# Patient Record
Sex: Female | Born: 1995 | Race: Black or African American | Hispanic: No | Marital: Married | Smoking: Never smoker
Health system: Southern US, Community
[De-identification: ages and names within clinical notes are randomized; demographics above are authoritative.]

## PROBLEM LIST (undated history)

## (undated) DIAGNOSIS — Z789 Other specified health status: Secondary | ICD-10-CM

## (undated) HISTORY — PX: NO PAST SURGERIES: SHX2092

---

## 2016-09-20 NOTE — L&D Delivery Note (Signed)
Patient is 21 y.o. G1P0 [redacted]w[redacted]d admitted in latent labor. S/p augmentation with  Pitocin. SROM at 0955.  Prenatal course also complicated by none.  Delivery Note At 3:35 PM a viable female was delivered via Vaginal, Spontaneous Delivery (Presentation: LOA ).  APGAR: 9, 9; weight pending.   Placenta status: Intact.  Cord: 3V with the following complications: None.  Cord pH: N/A  Anesthesia:  Epidural Episiotomy:  None Lacerations: 1st degree perineal  Suture Repair: None Est. Blood Loss (mL):  150  Upon arrival patient was complete and pushing. She pushed with good maternal effort to deliver a viable infant . Nuchal cord x1,easily reduced. Baby delivered without difficulty (anterior shoulder delivered with ease), was noted to have good tone and place on maternal abdomen for oral suctioning, drying and stimulation. Delayed cord clamping performed. Placenta delivered spontaneously with gentle cord traction. Fundus firm with massage and Pitocin. Perineum inspected and found to have 1st degree laceration, which was found to be hemostatic. Counts of sharps, instruments, and lap pads were all correct.  Mom to postpartum.  Baby to Couplet care / Skin to Skin.  Caryl Ada, DO OB Fellow

## 2017-04-18 LAB — OB RESULTS CONSOLE HIV ANTIBODY (ROUTINE TESTING): HIV: NONREACTIVE

## 2017-04-18 LAB — OB RESULTS CONSOLE VARICELLA ZOSTER ANTIBODY, IGG: VARICELLA IGG: IMMUNE

## 2017-04-18 LAB — OB RESULTS CONSOLE RUBELLA ANTIBODY, IGM: Rubella: IMMUNE

## 2017-04-18 LAB — OB RESULTS CONSOLE HEPATITIS B SURFACE ANTIGEN: Hepatitis B Surface Ag: NEGATIVE

## 2017-05-16 LAB — OB RESULTS CONSOLE GC/CHLAMYDIA
CHLAMYDIA, DNA PROBE: NEGATIVE
GC PROBE AMP, GENITAL: NEGATIVE

## 2017-05-16 LAB — OB RESULTS CONSOLE GBS: GBS: NEGATIVE

## 2017-06-18 ENCOUNTER — Encounter (HOSPITAL_COMMUNITY): Payer: Self-pay | Admitting: *Deleted

## 2017-06-18 ENCOUNTER — Inpatient Hospital Stay (HOSPITAL_COMMUNITY): Payer: Medicaid Other | Admitting: Anesthesiology

## 2017-06-18 ENCOUNTER — Inpatient Hospital Stay (HOSPITAL_COMMUNITY)
Admission: AD | Admit: 2017-06-18 | Discharge: 2017-06-20 | DRG: 807 | Disposition: A | Discharge status: Home / Self Care | Payer: Medicaid Other | Source: Ambulatory Visit | Attending: Obstetrics and Gynecology | Admitting: Obstetrics and Gynecology

## 2017-06-18 DIAGNOSIS — O26893 Other specified pregnancy related conditions, third trimester: Secondary | ICD-10-CM | POA: Diagnosis present

## 2017-06-18 DIAGNOSIS — Z3A40 40 weeks gestation of pregnancy: Secondary | ICD-10-CM

## 2017-06-18 HISTORY — DX: Other specified health status: Z78.9

## 2017-06-18 LAB — COMPREHENSIVE METABOLIC PANEL
ALBUMIN: 3.3 g/dL — AB (ref 3.5–5.0)
ALK PHOS: 199 U/L — AB (ref 38–126)
ALT: 19 U/L (ref 14–54)
AST: 29 U/L (ref 15–41)
Anion gap: 10 (ref 5–15)
BUN: 11 mg/dL (ref 6–20)
CALCIUM: 9.7 mg/dL (ref 8.9–10.3)
CHLORIDE: 101 mmol/L (ref 101–111)
CO2: 23 mmol/L (ref 22–32)
CREATININE: 0.71 mg/dL (ref 0.44–1.00)
GFR calc non Af Amer: >60 mL/min (ref >60)
GLUCOSE: 89 mg/dL (ref 65–99)
Potassium: 4.6 mmol/L (ref 3.5–5.1)
SODIUM: 134 mmol/L — AB (ref 135–145)
Total Bilirubin: 0.6 mg/dL (ref 0.3–1.2)
Total Protein: 7.9 g/dL (ref 6.5–8.1)

## 2017-06-18 LAB — PROTEIN / CREATININE RATIO, URINE
Creatinine, Urine: 85 mg/dL
PROTEIN CREATININE RATIO: 0.15 mg/mg{creat} (ref 0.00–0.15)
TOTAL PROTEIN, URINE: 13 mg/dL

## 2017-06-18 LAB — CBC
HCT: 35.7 % — ABNORMAL LOW (ref 36.0–46.0)
Hemoglobin: 11.9 g/dL — ABNORMAL LOW (ref 12.0–15.0)
MCH: 26.9 pg (ref 26.0–34.0)
MCHC: 33.3 g/dL (ref 30.0–36.0)
MCV: 80.6 fL (ref 78.0–100.0)
PLATELETS: 321 10*3/uL (ref 150–400)
RBC: 4.43 MIL/uL (ref 3.87–5.11)
RDW: 15.1 % (ref 11.5–15.5)
WBC: 9.3 10*3/uL (ref 4.0–10.5)

## 2017-06-18 LAB — TYPE AND SCREEN
ABO/RH(D): AB POS
ANTIBODY SCREEN: NEGATIVE

## 2017-06-18 LAB — RPR: RPR: NONREACTIVE

## 2017-06-18 LAB — ABO/RH: ABO/RH(D): AB POS

## 2017-06-18 MED ORDER — EPHEDRINE 5 MG/ML INJ: 10.0000 mg | INTRAVENOUS | Status: DC | PRN

## 2017-06-18 MED ORDER — FENTANYL 2.5 MCG/ML BUPIVACAINE 1/10 % EPIDURAL INFUSION (WH - ANES)
14.0000 mL/h | INTRAMUSCULAR | Status: DC | PRN
  Administered 2017-06-18 (×2): 14 mL/h via EPIDURAL
  Administered 2017-06-18: 12 mL/h via EPIDURAL
  Filled 2017-06-18: qty 100

## 2017-06-18 MED ORDER — DIPHENHYDRAMINE HCL 50 MG/ML IJ SOLN: 12.5000 mg | INTRAMUSCULAR | Status: DC | PRN

## 2017-06-18 MED ORDER — LACTATED RINGERS IV SOLN: 500.0000 mL | Freq: Once | INTRAVENOUS | Status: DC

## 2017-06-18 MED ORDER — WITCH HAZEL-GLYCERIN EX PADS: 1.0000 "application " | MEDICATED_PAD | CUTANEOUS | Status: DC | PRN

## 2017-06-18 MED ORDER — COCONUT OIL OIL: 1.0000 "application " | TOPICAL_OIL | Status: DC | PRN

## 2017-06-18 MED ORDER — FENTANYL 2.5 MCG/ML BUPIVACAINE 1/10 % EPIDURAL INFUSION (WH - ANES): 14.0000 mL/h | INTRAMUSCULAR | Status: DC | PRN

## 2017-06-18 MED ORDER — SIMETHICONE 80 MG PO CHEW: 80.0000 mg | CHEWABLE_TABLET | ORAL | Status: DC | PRN

## 2017-06-18 MED ORDER — PRENATAL MULTIVITAMIN CH
1.0000 | ORAL_TABLET | Freq: Every day | ORAL | Status: DC
  Administered 2017-06-19 – 2017-06-20 (×2): 1 via ORAL
  Filled 2017-06-18 (×2): qty 1

## 2017-06-18 MED ORDER — OXYTOCIN 40 UNITS IN LACTATED RINGERS INFUSION - SIMPLE MED: 2.5000 [IU]/h | INTRAVENOUS | Status: DC

## 2017-06-18 MED ORDER — OXYTOCIN BOLUS FROM INFUSION
500.0000 mL | Freq: Once | INTRAVENOUS | Status: AC
  Administered 2017-06-18: 500 mL via INTRAVENOUS

## 2017-06-18 MED ORDER — LIDOCAINE HCL (PF) 1 % IJ SOLN
30.0000 mL | INTRAMUSCULAR | Status: DC | PRN
  Filled 2017-06-18: qty 30

## 2017-06-18 MED ORDER — PHENYLEPHRINE 40 MCG/ML (10ML) SYRINGE FOR IV PUSH (FOR BLOOD PRESSURE SUPPORT)
80.0000 ug | PREFILLED_SYRINGE | INTRAVENOUS | Status: DC | PRN
  Filled 2017-06-18: qty 5

## 2017-06-18 MED ORDER — EPHEDRINE 5 MG/ML INJ
10.0000 mg | INTRAVENOUS | Status: DC | PRN
  Filled 2017-06-18: qty 2

## 2017-06-18 MED ORDER — LACTATED RINGERS IV SOLN
500.0000 mL | INTRAVENOUS | Status: DC | PRN
  Administered 2017-06-18: 250 mL via INTRAVENOUS

## 2017-06-18 MED ORDER — LIDOCAINE HCL (PF) 1 % IJ SOLN
INTRAMUSCULAR | Status: DC | PRN
  Administered 2017-06-18 (×2): 6 mL via EPIDURAL

## 2017-06-18 MED ORDER — ACETAMINOPHEN 325 MG PO TABS: 650.0000 mg | ORAL_TABLET | ORAL | Status: DC | PRN

## 2017-06-18 MED ORDER — OXYCODONE-ACETAMINOPHEN 5-325 MG PO TABS: 2.0000 | ORAL_TABLET | ORAL | Status: DC | PRN

## 2017-06-18 MED ORDER — IBUPROFEN 600 MG PO TABS
600.0000 mg | ORAL_TABLET | Freq: Four times a day (QID) | ORAL | Status: DC
  Administered 2017-06-18 – 2017-06-20 (×8): 600 mg via ORAL
  Filled 2017-06-18 (×8): qty 1

## 2017-06-18 MED ORDER — DIBUCAINE 1 % RE OINT: 1.0000 "application " | TOPICAL_OINTMENT | RECTAL | Status: DC | PRN

## 2017-06-18 MED ORDER — PHENYLEPHRINE 40 MCG/ML (10ML) SYRINGE FOR IV PUSH (FOR BLOOD PRESSURE SUPPORT): 80.0000 ug | PREFILLED_SYRINGE | INTRAVENOUS | Status: DC | PRN

## 2017-06-18 MED ORDER — ONDANSETRON HCL 4 MG/2ML IJ SOLN
4.0000 mg | Freq: Four times a day (QID) | INTRAMUSCULAR | Status: DC | PRN
  Administered 2017-06-18: 4 mg via INTRAVENOUS
  Filled 2017-06-18 (×2): qty 2

## 2017-06-18 MED ORDER — TETANUS-DIPHTH-ACELL PERTUSSIS 5-2.5-18.5 LF-MCG/0.5 IM SUSP: 0.5000 mL | Freq: Once | INTRAMUSCULAR | Status: DC

## 2017-06-18 MED ORDER — OXYCODONE-ACETAMINOPHEN 5-325 MG PO TABS: 1.0000 | ORAL_TABLET | ORAL | Status: DC | PRN

## 2017-06-18 MED ORDER — LACTATED RINGERS IV SOLN
INTRAVENOUS | Status: DC
  Administered 2017-06-18 (×2): via INTRAVENOUS

## 2017-06-18 MED ORDER — TERBUTALINE SULFATE 1 MG/ML IJ SOLN
0.2500 mg | Freq: Once | INTRAMUSCULAR | Status: DC | PRN
  Filled 2017-06-18: qty 1

## 2017-06-18 MED ORDER — FENTANYL CITRATE (PF) 100 MCG/2ML IJ SOLN: 100.0000 ug | INTRAMUSCULAR | Status: DC | PRN

## 2017-06-18 MED ORDER — OXYTOCIN 40 UNITS IN LACTATED RINGERS INFUSION - SIMPLE MED
1.0000 m[IU]/min | INTRAVENOUS | Status: DC
  Administered 2017-06-18: 2 m[IU]/min via INTRAVENOUS
  Filled 2017-06-18: qty 1000

## 2017-06-18 MED ORDER — ACETAMINOPHEN 325 MG PO TABS
650.0000 mg | ORAL_TABLET | ORAL | Status: DC | PRN
  Administered 2017-06-19: 650 mg via ORAL
  Filled 2017-06-18: qty 2

## 2017-06-18 MED ORDER — FENTANYL 2.5 MCG/ML BUPIVACAINE 1/10 % EPIDURAL INFUSION (WH - ANES)
INTRAMUSCULAR | Status: AC
  Administered 2017-06-18: 14 mL/h via EPIDURAL
  Filled 2017-06-18: qty 100

## 2017-06-18 MED ORDER — LACTATED RINGERS IV BOLUS (SEPSIS)
1000.0000 mL | Freq: Once | INTRAVENOUS | Status: AC
  Administered 2017-06-18: 1000 mL via INTRAVENOUS

## 2017-06-18 MED ORDER — ONDANSETRON HCL 4 MG PO TABS: 4.0000 mg | ORAL_TABLET | ORAL | Status: DC | PRN

## 2017-06-18 MED ORDER — LACTATED RINGERS IV SOLN
500.0000 mL | Freq: Once | INTRAVENOUS | Status: AC
  Administered 2017-06-18: 500 mL via INTRAVENOUS

## 2017-06-18 MED ORDER — ONDANSETRON HCL 4 MG/2ML IJ SOLN
4.0000 mg | INTRAMUSCULAR | Status: DC | PRN
  Administered 2017-06-18: 4 mg via INTRAVENOUS

## 2017-06-18 MED ORDER — SOD CITRATE-CITRIC ACID 500-334 MG/5ML PO SOLN: 30.0000 mL | ORAL | Status: DC | PRN

## 2017-06-18 MED ORDER — DIPHENHYDRAMINE HCL 25 MG PO CAPS: 25.0000 mg | ORAL_CAPSULE | Freq: Four times a day (QID) | ORAL | Status: DC | PRN

## 2017-06-18 MED ORDER — SENNOSIDES-DOCUSATE SODIUM 8.6-50 MG PO TABS
2.0000 | ORAL_TABLET | ORAL | Status: DC
  Administered 2017-06-19 – 2017-06-20 (×2): 2 via ORAL
  Filled 2017-06-18 (×2): qty 2

## 2017-06-18 MED ORDER — BENZOCAINE-MENTHOL 20-0.5 % EX AERO
1.0000 "application " | INHALATION_SPRAY | CUTANEOUS | Status: DC | PRN
  Administered 2017-06-18: 1 via TOPICAL
  Filled 2017-06-18: qty 56

## 2017-06-18 MED ORDER — ONDANSETRON HCL 4 MG/2ML IJ SOLN: 4.0000 mg | INTRAMUSCULAR | Status: DC | PRN

## 2017-06-18 MED ORDER — PHENYLEPHRINE 40 MCG/ML (10ML) SYRINGE FOR IV PUSH (FOR BLOOD PRESSURE SUPPORT)
PREFILLED_SYRINGE | INTRAVENOUS | Status: AC
  Filled 2017-06-18: qty 20

## 2017-06-18 MED ORDER — ZOLPIDEM TARTRATE 5 MG PO TABS: 5.0000 mg | ORAL_TABLET | Freq: Every evening | ORAL | Status: DC | PRN

## 2017-06-18 NOTE — MAU Note (Signed)
PT SAYS UC STARTED STRONG  AT [place].   HAD AN APPOINTMENT  TODAY- DID NOT GO.  NO VE IN OFFICE    GBS- NEG

## 2017-06-18 NOTE — H&P (Signed)
Natalie Cunningham is a 21 y.o. female G1 @ 40.0 by 33wk scan presenting for stronger ctx starting at midnight. Denies leaking or bldg. No H/A, visual disturbances, or RUQ pain. Her preg has been followed by the [facility] since 33wks and has been essentially unremarkable.  OB History    Gravida Para Term Preterm AB Living   1             SAB TAB Ectopic Multiple Live Births                 Past Medical History:  Diagnosis Date  . Medical history non-contributory    Past Surgical History:  Procedure Laterality Date  . NO PAST SURGERIES     Family History: family history includes Cancer in her father. Social History:  reports that she has never smoked. She has never used smokeless tobacco. She reports that she does not drink alcohol or use drugs.     Maternal Diabetes: No Genetic Screening: Declined- too late Maternal Ultrasounds/Referrals: Normal Fetal Ultrasounds or other Referrals:  None Maternal Substance Abuse:  No Significant Maternal Medications:  None Significant Maternal Lab Results:  Lab values include: Group B Strep negative Other Comments:  None  ROS History Dilation: 4 Effacement (%): 80 Station: -2 Exam by:: benji stanley RN Blood pressure 127/78, pulse 91, temperature 98.7 F (37.1 C), temperature source Oral, resp. rate 18, height  (1.626 m), weight 86.6 kg (191 lb), SpO2 100 %.  BPs 149/85, 140/88 in MAU; since then 127/78, 129/85  Exam Physical Exam  Constitutional: She is oriented to person, place, and time. She appears well-developed.  HENT:  Head: Normocephalic.  Neck: Normal range of motion.  Cardiovascular: Normal rate.   Respiratory: Effort normal.  GI:  EFM 130s, min LTV, some 10x10accels, no decels Ctx q 3-5 mins, spont  Musculoskeletal: Normal range of motion.  Neurological: She is alert and oriented to person, place, and time.  Skin: Skin is warm and dry.  Psychiatric: She has a normal mood and affect. Her behavior is normal. Thought  content normal.    CBC    Component Value Date/Time   WBC 9.3 06/18/2017 0340   RBC 4.43 06/18/2017 0340   HGB 11.9 (L) 06/18/2017 0340   HCT 35.7 (L) 06/18/2017 0340   PLT 321 06/18/2017 0340   MCV 80.6 06/18/2017 0340   MCH 26.9 06/18/2017 0340   MCHC 33.3 06/18/2017 0340   RDW 15.1 06/18/2017 0340   CMP     Component Value Date/Time   NA 134 (L) 06/18/2017 0340   K 4.6 06/18/2017 0340   CL 101 06/18/2017 0340   CO2 23 06/18/2017 0340   GLUCOSE 89 06/18/2017 0340   BUN 11 06/18/2017 0340   CREATININE 0.71 06/18/2017 0340   CALCIUM 9.7 06/18/2017 0340   PROT 7.9 06/18/2017 0340   ALBUMIN 3.3 (L) 06/18/2017 0340   AST 29 06/18/2017 0340   ALT 19 06/18/2017 0340   ALKPHOS 199 (H) 06/18/2017 0340   BILITOT 0.6 06/18/2017 0340   GFRNONAA >60 06/18/2017 0340   GFRAA >60 06/18/2017 0340   Urine P/C ratio: 0.15  Prenatal labs: ABO, Rh:  AB pos Antibody:  neg Rubella:  immune RPR:   NR HBsAg:   neg HIV:   NR GBS:   neg  Assessment/Plan: IUP@term  Latent labor Some elevated BPs GBS neg  Admit to [employer] Expectant management with potential need to augment Watch BPs Anticipate SVD   Signe Tackitt CNM 06/18/2017,  5:01 AM

## 2017-06-18 NOTE — Progress Notes (Signed)
Labor Progress Note  Natalie Cunningham is a 21 y.o. G1P0 at [redacted]w[redacted]d  admitted for SOL  S: Resting comfortably with epidural. No concerns  O:  BP 128/80   Pulse 83   Temp 98 F (36.7 C) (Axillary)   Resp 18   Ht  (1.626 m)   Wt 86.6 kg (191 lb)   SpO2 100%   BMI 32.79 kg/m   Total I/O In: -  Out: 450 [Urine:450]  FHT:  FHR: 125 bpm, variability: moderate,  accelerations:  Present,  decelerations:  Present early UC:   regular, every 1-3 minutes SVE:   Dilation: 7.5 Effacement (%): 70 Station: 0 Exam by:: Marcelino Duster, RN  SROM: clear 0955  Pitocin @ 4 mu/min  Labs: Lab Results  Component Value Date   WBC 9.3 06/18/2017   HGB 11.9 (L) 06/18/2017   HCT 35.7 (L) 06/18/2017   MCV 80.6 06/18/2017   PLT 321 06/18/2017    Assessment / Plan: 21 y.o. G1P0 [redacted]w[redacted]d in active labor Augmentation of labor, progressing well  Labor: Progressing on Pitocin Fetal Wellbeing:  Category I Pain Control:  Epidural Anticipated MOD:  NSVD  Expectant management   Caryl Ada, DO OB Fellow

## 2017-06-18 NOTE — Anesthesia Procedure Notes (Signed)
Epidural Patient location during procedure: OB Start time: 06/18/2017 5:27 AM End time: 06/18/2017 5:54 AM  Staffing Anesthesiologist: Jairo Ben Performed: anesthesiologist   Preanesthetic Checklist Completed: patient identified, surgical consent, pre-op evaluation, timeout performed, IV checked, risks and benefits discussed and monitors and equipment checked  Epidural Patient position: sitting Prep: site prepped and draped and DuraPrep Patient monitoring: blood pressure, continuous pulse ox and heart rate Approach: midline Location: L2-L3 Injection technique: LOR air  Needle:  Needle type: Tuohy  Needle gauge: 17 G Needle length: 9 cm Needle insertion depth: 4.5 cm Catheter type: closed end flexible Catheter size: 19 Gauge Catheter at skin depth: 10 cm Test dose: negative (1% lidocaine)  Assessment Events: blood not aspirated, injection not painful, no injection resistance, negative IV test and no paresthesia  Additional Notes Pt identified in Labor room.  Monitors applied. Working IV access confirmed. Sterile prep, drape lumbar spine.  1% lido local L 2,3.  #17ga Touhy LOR air at 4.5 cm L 2,3, cath in easily to 10 cm skin. Test dose OK, cath dosed and infusion begun.  Patient asymptomatic, VSS, no heme aspirated, tolerated well.  Sandford Craze, MDReason for block:procedure for pain

## 2017-06-18 NOTE — Anesthesia Pain Management Evaluation Note (Signed)
  CRNA Pain Management Visit Note  Patient: Natalie Cunningham, 21 y.o., female  "Hello I am a member of the anesthesia team at [facility]. We have an anesthesia team available at all times to provide care throughout the hospital, including epidural management and anesthesia for C-section. I don't know your plan for the delivery whether it a natural birth, water birth, IV sedation, nitrous supplementation, doula or epidural, but we want to meet your pain goals."   1.Was your pain managed to your expectations on prior hospitalizations?   No prior hospitalizations  2.What is your expectation for pain management during this hospitalization?     Epidural  3.How can we help you reach that goal? Same  Record the patient's initial score and the patient's pain goal.   Pain: 0  Pain Goal: 1 [facility] wants you to be able to say your pain was always managed very well.  [name] 06/18/2017

## 2017-06-18 NOTE — Anesthesia Preprocedure Evaluation (Signed)
Anesthesia Evaluation  Patient identified by MRN, date of birth, ID band Patient awake    Reviewed: Allergy & Precautions, NPO status , Patient's Chart, lab work & pertinent test results  History of Anesthesia Complications Negative for: history of anesthetic complications  Airway Mallampati: II  TM Distance: >3 FB Neck ROM: Full    Dental  (+) Dental Advisory Given   Pulmonary neg pulmonary ROS,    breath sounds clear to auscultation       Cardiovascular negative cardio ROS   Rhythm:Regular Rate:Normal     Neuro/Psych negative neurological ROS     GI/Hepatic Neg liver ROS, GERD  ,  Endo/Other    Renal/GU negative Renal ROS     Musculoskeletal   Abdominal   Peds  Hematology plt 321k   Anesthesia Other Findings   Reproductive/Obstetrics (+) Pregnancy                             Anesthesia Physical Anesthesia Plan  ASA: II  Anesthesia Plan: Epidural   Post-op Pain Management:    Induction:   PONV Risk Score and Plan: Treatment may vary due to age or medical condition  Airway Management Planned: Natural Airway  Additional Equipment:   Intra-op Plan:   Post-operative Plan:   Informed Consent: I have reviewed the patients History and Physical, chart, labs and discussed the procedure including the risks, benefits and alternatives for the proposed anesthesia with the patient or authorized representative who has indicated his/her understanding and acceptance.   Dental advisory given  Plan Discussed with:   Anesthesia Plan Comments: (Patient identified. Risks/Benefits/Options discussed with patient including but not limited to bleeding, infection, nerve damage, paralysis, failed block, incomplete pain control, headache, blood pressure changes, nausea, vomiting, reactions to medication both or allergic, itching and postpartum back pain. Confirmed with bedside nurse the patient's  most recent platelet count. Confirmed with patient that they are not currently taking any anticoagulation, have any bleeding history or any family history of bleeding disorders. Patient expressed understanding and wished to proceed. All questions were answered. )        Anesthesia Quick Evaluation

## 2017-06-19 NOTE — Clinical Social Work Maternal (Signed)
CLINICAL SOCIAL WORK MATERNAL/CHILD NOTE  Patient Details  Name: Natalie Cunningham MRN: [id number] Date of Birth: 26-Feb-1996  Date:  06/19/2017  Clinical Social Worker Initiating Note:  Natalie Cunningham, [place] Date/Time: Initiated:  06/19/17/1100     Child's Name:  Natalie Cunningham   Biological Parents:  Mother, Father Natalie Cunningham and Natalie Cunningham)   Need for Interpreter:  None   Reason for Referral:  Behavioral Health Concerns, Late or No Prenatal Care    Address:  [place] [address]    Phone number:  [phone] (home)     Additional phone number:  Household Members/Support Persons (HM/SP):   Household Member/Support Person 1   HM/SP Name Relationship DOB or Age  HM/SP -1 Natalie Cunningham FOB/husband    HM/SP -2        HM/SP -3        HM/SP -4        HM/SP -5        HM/SP -6        HM/SP -7        HM/SP -8          Natural Supports (not living in the home):  Friends, Extended Family, Immediate Family   Professional Supports: Transport planner (MOB [facility])   Employment: Part-time, Animator   Type of Work: MOB works part-time as a Charity fundraiser.  FOB is a TEFL teacher.   Education:      Homebound arranged:    Financial Resources:  Medicaid, Multimedia programmer   Other Resources:      Cultural/Religious Considerations Which May Impact Care: None stated.  Strengths:  Ability to meet basic needs , Home prepared for child    Psychotropic Medications:         Pediatrician:       Pediatrician List:   [facility]      Pediatrician Fax Number:    Risk Factors/Current Problems:  Family/Relationship Issues  (MOB reports recent "marital issues")   Cognitive State:  Alert , Able to Concentrate , Linear Thinking , Goal Oriented    Mood/Affect:  Euthymic , Calm , Comfortable ,  Interested    CSW Assessment: CSW met with MOB in her first floor room/141 to offer support and complete assessment due to [facility] and moderate depression noted on screening tool during PNV.  CSW notes that patient was referred to LCS[facility]. MOB was resting when CSW arrived, but she stated this was a good time to talk with her and was welcoming of the visit.  Baby remained asleep in the bassinet while MOB and CSW spoke. MOB reports that pregnancy as well as labor and delivery went "smooth" and that she feels "excited" about becoming a mother.  MOB states that her best friend/baby's Godmother was her support person during labor and delivery because her husband did not make it back in time.  She states her husband is a Administrator and "thought I would go past my due date so he wasn't here."  MOB delivered on her due date.  She states "it was disappointing, but why stress?"  She states he is making his way back to [place] from his last assignment now.  She reports feeling well supported by her natural support system and states that she continues  to follow up with [facility].  She states she and her husband have had some "marital issues" that they are working on and she is hopeful that he will eventually be willing to go to counseling with her.  She reports that they have been married for 4 years and that this is their first child.  CSW did not ask MOB to elaborated on their "issues" if she did not want to, but assessed for safety.  MOB denies any safety concerns.  MOB states she has positive coping mechanisms such as drawing, spending time with her friends, going for walks, and shopping for baby needs.  She feels her mental health needs are being met by meeting with LCSW and states she is not interested in taking psychotropic medication.   MOB explained Natalie Cunningham as a result of lack of insurance coverage.  She states she was not working full time and that  [employer] plan she had did not cover maternity expenses.  She states she eventually applied for Medicaid.  She reports that baby will be added to [name] plan and also have Medicaid.  MOB states understanding of hospital drug screen policy and no concerns.  She denies all substance use.  Baby's UDS is negative and CDS is pending.  CSW Plan/Description:  No Further Intervention Required/No Barriers to Discharge, Sudden Infant Death Syndrome (SIDS) Education, Perinatal Mood and Anxiety Disorder (PMADs) Education, [place], CSW Will Continue to Monitor Umbilical Cord Tissue Drug Screen Results and Make Report if Natalie Cunningham 06/19/2017, 4:20 PM

## 2017-06-19 NOTE — Anesthesia Postprocedure Evaluation (Signed)
Anesthesia Post Note  Patient: Natalie Cunningham  Procedure(s) Performed: * No procedures listed *     Patient location during evaluation: Mother Baby Anesthesia Type: Epidural Level of consciousness: awake and alert and oriented Pain management: satisfactory to patient Vital Signs Assessment: post-procedure vital signs reviewed and stable Respiratory status: respiratory function stable Cardiovascular status: stable Postop Assessment: no headache, no backache, epidural receding, patient able to bend at knees, no signs of nausea or vomiting and adequate PO intake Anesthetic complications: no    Last Vitals:  Vitals:   06/19/17 0509 06/19/17 0830  BP: 114/74 117/73  Pulse: 66 70  Resp: 16 18  Temp: 36.9 C 36.7 C  SpO2:      Last Pain:  Vitals:   06/19/17 0830  TempSrc: Oral  PainSc: 0-No pain   Pain Goal:                 Phill Steck

## 2017-06-19 NOTE — Lactation Note (Signed)
This note was copied from a baby's chart. Lactation Consultation Note New mom states baby BF well. She isn't sure if baby is getting enough or anything.  Mom has pendulous breast w/everted nipples at the bottle end of the breast. Hand expression taught w/few dots of colostrum noted. encouraged mom to assess breast before and after BF for transfer. Mom's Lt. Breast softer than Rt. Breast. Mom had recently BF on the Lt. Breast.  Newborn behavior discussed as well as I&O, STS, cluster feeding and supply and demand. Mom encouraged to feed baby 8-12 times/24 hours and with feeding cues.  WH/LC brochure given w/resources, support groups and LC services. Patient Name: Natalie Cunningham [facility] ZOXWR'U Date: 06/19/2017 Reason for consult: Initial assessment   Maternal Data Has patient been taught Hand Expression?: Yes Does the patient have breastfeeding experience prior to this delivery?: No  Feeding Feeding Type: Breast Fed Length of feed: 10 min (still BF)  LATCH Score Latch: Grasps breast easily, tongue down, lips flanged, rhythmical sucking.  Audible Swallowing: A few with stimulation  Type of Nipple: Everted at rest and after stimulation  Comfort (Breast/Nipple): Soft / non-tender  Hold (Positioning): Assistance needed to correctly position infant at breast and maintain latch.  LATCH Score: 8  Interventions Interventions: Breast feeding basics reviewed;Breast compression;Assisted with latch;Adjust position;Support pillows;Breast massage;Position options;Hand express  Lactation Tools Discussed/Used     Consult Status Consult Status: Follow-up Date: 06/20/17 Follow-up type: In-patient    Kamani Lewter, Diamond Nickel 06/19/2017, 5:09 AM

## 2017-06-19 NOTE — Lactation Note (Signed)
This note was copied from a baby's chart. Lactation Consultation Note  Patient Name: Natalie Cunningham [facility] ZOXWR'U Date: 06/19/2017 Reason for consult: Follow-up assessment  Mom says that nursing is going well. She has no breast complaints. Mom has done an excellent job of feeding infant during the 1st 24 hours of life. Mom knows to continue w/8-12 feedings/24 hours. Mom's questions about feeding duration answered.   Mom's feeding intention at admission had said breast/formula. RN was in room & clarified with patient her feeding intention. At this point, mother only wants to offer breast.  Lurline Hare [facility] 06/19/2017, 3:56 PM

## 2017-06-19 NOTE — Progress Notes (Signed)
CSW assessment completed.  No barriers to discharge.  Full documentation to follow. 

## 2017-06-19 NOTE — Progress Notes (Signed)
PPD # 1 SVD Information for the patient's newborn:  Natalie Cunningham, Natalie Cunningham [[id number]]  female    breast and bottle feeding   S:  Reports feeling well.  Wants to stay another night.             Tolerating po/ No nausea or vomiting             Bleeding is light             Pain controlled with ibuprofen (OTC)             Up ad lib / ambulatory / voiding without difficulties    Denies any HA/N/V/vision changes    O:  A & O x 3, in no apparent distress              VS:  Vitals:   06/18/17 1800 06/18/17 1955 06/19/17 0025 06/19/17 0509  BP: 133/75 126/70 119/65 114/74  Pulse: 85 81 100 66  Resp: Temp: 98.2 F (36.8 C) 97.7 F (36.5 C) 98.3 F (36.8 C) 98.4 F (36.9 C)  TempSrc: Oral Oral Oral   SpO2:      Weight:      Height:        LABS:   Recent Labs  06/18/17 0340  WBC 9.3  HGB 11.9*  HCT 35.7*  PLT 321    Blood type: --/--/AB POS, AB POS (09/29 0340)  Rubella: Immune (07/30 0000)   I&O: I/O last 3 completed shifts: In: -  Out: 925 [Urine:700; Blood:225]          No intake/output data recorded.  Lungs: Clear and unlabored  Heart: regular rate and rhythm / no murmurs  Abdomen: soft, non-tender, non-distended             Fundus: firm, non-tender, U-2   Perineum: intact   Lochia: scant  Extremities: no edema, no calf pain or tenderness    A/P: PPD # 1 21 y.o., G1P1001   Active Problems:   Indication for care in labor or delivery   Postpartum care following vaginal delivery   Doing well - stable status  Routine post partum orders  Undecided about birth control methods, reviewed POP and IUD  Anticipate discharge tomorrow    Apolonio Schneiders, BSN, SNM 06/19/2017, 7:30 AM  I confirm that I have verified the information documented in the SNM's note and that I have also personally reperformed the physical exam and all medical decision making activities.  .Patient was seen and examined by me also Agree with note Vitals stable Labs  stable Fundus firm, lochia within normal limits Perineum healing Ext WNL Continue care Anticipate discharge tomorrow  Aviva Signs, CNM

## 2017-06-20 MED ORDER — IBUPROFEN 600 MG PO TABS: 600.0000 mg | ORAL_TABLET | Freq: Four times a day (QID) | ORAL | 0 refills | Status: AC

## 2017-06-20 MED ORDER — NORETHINDRONE 0.35 MG PO TABS: 1.0000 | ORAL_TABLET | Freq: Every day | ORAL | 11 refills | Status: AC

## 2017-06-20 NOTE — Lactation Note (Signed)
This note was copied from a baby's chart. Lactation Consultation Note  Patient Name: Natalie Cunningham [facility] ZOXWR'U Date: 06/20/2017 Reason for consult: Follow-up assessment;Infant weight loss  Baby is 56 hours old and exclusively breast fed.  LC reviewed doc flow sheets. Mom denies soreness, sore nipple and engorgement prevention and tx reviewed.  LC instructed  Mom on the use of hand pump.  Mother informed of post-discharge support and given phone number to the lactation department, including services for phone call assistance; out-patient appointments; and breastfeeding support group. List of other breastfeeding resources in the community given in the handout. Encouraged mother to call for problems or concerns related to breastfeeding.   Maternal Data Has patient been taught Hand Expression?: Yes  Feeding Feeding Type:  (baby recently breast fed ) Length of feed: 20 min (per RN per mom )  LATCH Score Latch: Grasps breast easily, tongue down, lips flanged, rhythmical sucking.  Audible Swallowing: Spontaneous and intermittent  Type of Nipple: Everted at rest and after stimulation  Comfort (Breast/Nipple): Filling, red/small blisters or bruises, mild/mod discomfort  Hold (Positioning): No assistance needed to correctly position infant at breast.  LATCH Score: 9  Interventions Interventions: Breast feeding basics reviewed;Hand pump  Lactation Tools Discussed/Used Tools: Pump Breast pump type: Manual WIC Program: No (pe rmom plans to ) Pump Review: Setup, frequency, and cleaning Initiated by:: MAI  Date initiated:: 06/20/17   Consult Status Consult Status: Complete Date: 06/20/17    Natalie Cunningham Natalie Cunningham 06/20/2017, 11:21 AM

## 2017-06-20 NOTE — Discharge Instructions (Signed)
Breastfeeding °Deciding to breastfeed is one of the best choices you can make for you and your baby. A change in hormones during pregnancy causes your breast tissue to grow and increases the number and size of your milk ducts. These hormones also allow proteins, sugars, and fats from your blood supply to make breast milk in your milk-producing glands. Hormones prevent breast milk from being released before your baby is born as well as prompt milk flow after birth. Once breastfeeding has begun, thoughts of your baby, as well as his or her sucking or crying, can stimulate the release of milk from your milk-producing glands. °Benefits of breastfeeding °For Your Baby °· Your first milk (colostrum) helps your baby's digestive system function better. °· There are antibodies in your milk that help your baby fight off infections. °· Your baby has a lower incidence of asthma, allergies, and sudden infant death syndrome. °· The nutrients in breast milk are better for your baby than infant formulas and are designed uniquely for your baby’s needs. °· Breast milk improves your baby's brain development. °· Your baby is less likely to develop other conditions, such as childhood obesity, asthma, or type 2 diabetes mellitus. ° °For You °· Breastfeeding helps to create a very special bond between you and your baby. °· Breastfeeding is convenient. Breast milk is always available at the correct temperature and costs nothing. °· Breastfeeding helps to burn calories and helps you lose the weight gained during pregnancy. °· Breastfeeding makes your uterus contract to its prepregnancy size faster and slows bleeding (lochia) after you give birth. °· Breastfeeding helps to lower your risk of developing type 2 diabetes mellitus, osteoporosis, and breast or ovarian cancer later in life. ° °Signs that your baby is hungry °Early Signs of Hunger °· Increased alertness or activity. °· Stretching. °· Movement of the head from side to  side. °· Movement of the head and opening of the mouth when the corner of the mouth or cheek is stroked (rooting). °· Increased sucking sounds, smacking lips, cooing, sighing, or squeaking. °· Hand-to-mouth movements. °· Increased sucking of fingers or hands. ° °Late Signs of Hunger °· Fussing. °· Intermittent crying. ° °Extreme Signs of Hunger °Signs of extreme hunger will require calming and consoling before your baby will be able to breastfeed successfully. Do not wait for the following signs of extreme hunger to occur before you initiate breastfeeding: °· Restlessness. °· A loud, strong cry. °· Screaming. ° °Breastfeeding basics °Breastfeeding Initiation °· Find a comfortable place to sit or lie down, with your neck and back well supported. °· Place a pillow or rolled up blanket under your baby to bring him or her to the level of your breast (if you are seated). Nursing pillows are specially designed to help support your arms and your baby while you breastfeed. °· Make sure that your baby's abdomen is facing your abdomen. °· Gently massage your breast. With your fingertips, massage from your chest wall toward your nipple in a circular motion. This encourages milk flow. You may need to continue this action during the feeding if your milk flows slowly. °· Support your breast with 4 fingers underneath and your thumb above your nipple. Make sure your fingers are well away from your nipple and your baby’s mouth. °· Stroke your baby's lips gently with your finger or nipple. °· When your baby's mouth is open wide enough, quickly bring your baby to your breast, placing your entire nipple and as much of the colored area   around your nipple (areola) as possible into your baby's mouth. °? More areola should be visible above your baby's upper lip than below the lower lip. °? Your baby's tongue should be between his or her lower gum and your breast. °· Ensure that your baby's mouth is correctly positioned around your nipple  (latched). Your baby's lips should create a seal on your breast and be turned out (everted). °· It is common for your baby to suck about 2-3 minutes in order to start the flow of breast milk. ° °Latching °Teaching your baby how to latch on to your breast properly is very important. An improper latch can cause nipple pain and decreased milk supply for you and poor weight gain in your baby. Also, if your baby is not latched onto your nipple properly, he or she may swallow some air during feeding. This can make your baby fussy. Burping your baby when you switch breasts during the feeding can help to get rid of the air. However, teaching your baby to latch on properly is still the best way to prevent fussiness from swallowing air while breastfeeding. °Signs that your baby has successfully latched on to your nipple: °· Silent tugging or silent sucking, without causing you pain. °· Swallowing heard between every 3-4 sucks. °· Muscle movement above and in front of his or her ears while sucking. ° °Signs that your baby has not successfully latched on to nipple: °· Sucking sounds or smacking sounds from your baby while breastfeeding. °· Nipple pain. ° °If you think your baby has not latched on correctly, slip your finger into the corner of your baby’s mouth to break the suction and place it between your baby's gums. Attempt breastfeeding initiation again. °Signs of Successful Breastfeeding °Signs from your baby: °· A gradual decrease in the number of sucks or complete cessation of sucking. °· Falling asleep. °· Relaxation of his or her body. °· Retention of a small amount of milk in his or her mouth. °· Letting go of your breast by himself or herself. ° °Signs from you: °· Breasts that have increased in firmness, weight, and size 1-3 hours after feeding. °· Breasts that are softer immediately after breastfeeding. °· Increased milk volume, as well as a change in milk consistency and color by the fifth day of  breastfeeding. °· Nipples that are not sore, cracked, or bleeding. ° °Signs That Your Baby is Getting Enough Milk °· Wetting at least 1-2 diapers during the first 24 hours after birth. °· Wetting at least 5-6 diapers every 24 hours for the first week after birth. The urine should be clear or pale yellow by 5 days after birth. °· Wetting 6-8 diapers every 24 hours as your baby continues to grow and develop. °· At least 3 stools in a 24-hour period by age 5 days. The stool should be soft and yellow. °· At least 3 stools in a 24-hour period by age 7 days. The stool should be seedy and yellow. °· No loss of weight greater than 10% of birth weight during the first 3 days of age. °· Average weight gain of 4-7 ounces (113-198 g) per week after age 4 days. °· Consistent daily weight gain by age 5 days, without weight loss after the age of 2 weeks. ° °After a feeding, your baby may spit up a small amount. This is common. °Breastfeeding frequency and duration °Frequent feeding will help you make more milk and can prevent sore nipples and breast engorgement. Breastfeed when   you feel the need to reduce the fullness of your breasts or when your baby shows signs of hunger. This is called "breastfeeding on demand." Avoid introducing a pacifier to your baby while you are working to establish breastfeeding (the first 4-6 weeks after your baby is born). After this time you may choose to use a pacifier. Research has shown that pacifier use during the first year of a baby's life decreases the risk of sudden infant death syndrome (SIDS). °Allow your baby to feed on each breast as long as he or she wants. Breastfeed until your baby is finished feeding. When your baby unlatches or falls asleep while feeding from the first breast, offer the second breast. Because newborns are often sleepy in the first few weeks of life, you may need to awaken your baby to get him or her to feed. °Breastfeeding times will vary from baby to baby. However,  the following rules can serve as a guide to help you ensure that your baby is properly fed: °· Newborns (babies 4 weeks of age or younger) may breastfeed every 1-3 hours. °· Newborns should not go longer than 3 hours during the day or 5 hours during the night without breastfeeding. °· You should breastfeed your baby a minimum of 8 times in a 24-hour period until you begin to introduce solid foods to your baby at around 6 months of age. ° °Breast milk pumping °Pumping and storing breast milk allows you to ensure that your baby is exclusively fed your breast milk, even at times when you are unable to breastfeed. This is especially important if you are going back to work while you are still breastfeeding or when you are not able to be present during feedings. Your lactation consultant can give you guidelines on how long it is safe to store breast milk. °A breast pump is a machine that allows you to pump milk from your breast into a sterile bottle. The pumped breast milk can then be stored in a refrigerator or freezer. Some breast pumps are operated by hand, while others use electricity. Ask your lactation consultant which type will work best for you. Breast pumps can be purchased, but some hospitals and breastfeeding support groups lease breast pumps on a monthly basis. A lactation consultant can teach you how to hand express breast milk, if you prefer not to use a pump. °Caring for your breasts while you breastfeed °Nipples can become dry, cracked, and sore while breastfeeding. The following recommendations can help keep your breasts moisturized and healthy: °· Avoid using soap on your nipples. °· Wear a supportive bra. Although not required, special nursing bras and tank tops are designed to allow access to your breasts for breastfeeding without taking off your entire bra or top. Avoid wearing underwire-style bras or extremely tight bras. °· Air dry your nipples for 3-4 minutes after each feeding. °· Use only cotton  bra pads to absorb leaked breast milk. Leaking of breast milk between feedings is normal. °· Use lanolin on your nipples after breastfeeding. Lanolin helps to maintain your skin's normal moisture barrier. If you use pure lanolin, you do not need to wash it off before feeding your baby again. Pure lanolin is not toxic to your baby. You may also hand express a few drops of breast milk and gently massage that milk into your nipples and allow the milk to air dry. ° °In the first few weeks after giving birth, some women experience extremely full breasts (engorgement). Engorgement can make your   breasts feel heavy, warm, and tender to the touch. Engorgement peaks within 3-5 days after you give birth. The following recommendations can help ease engorgement: °· Completely empty your breasts while breastfeeding or pumping. You may want to start by applying warm, moist heat (in the shower or with warm water-soaked hand towels) just before feeding or pumping. This increases circulation and helps the milk flow. If your baby does not completely empty your breasts while breastfeeding, pump any extra milk after he or she is finished. °· Wear a snug bra (nursing or regular) or tank top for 1-2 days to signal your body to slightly decrease milk production. °· Apply ice packs to your breasts, unless this is too uncomfortable for you. °· Make sure that your baby is latched on and positioned properly while breastfeeding. ° °If engorgement persists after 48 hours of following these recommendations, contact your health care provider or a lactation consultant. °Overall health care recommendations while breastfeeding °· Eat healthy foods. Alternate between meals and snacks, eating 3 of each per day. Because what you eat affects your breast milk, some of the foods may make your baby more irritable than usual. Avoid eating these foods if you are sure that they are negatively affecting your baby. °· Drink milk, fruit juice, and water to  satisfy your thirst (about 10 glasses a day). °· Rest often, relax, and continue to take your prenatal vitamins to prevent fatigue, stress, and anemia. °· Continue breast self-awareness checks. °· Avoid chewing and smoking tobacco. Chemicals from cigarettes that pass into breast milk and exposure to secondhand smoke may harm your baby. °· Avoid alcohol and drug use, including marijuana. °Some medicines that may be harmful to your baby can pass through breast milk. It is important to ask your health care provider before taking any medicine, including all over-the-counter and prescription medicine as well as vitamin and herbal supplements. °It is possible to become pregnant while breastfeeding. If birth control is desired, ask your health care provider about options that will be safe for your baby. °Contact a health care provider if: °· You feel like you want to stop breastfeeding or have become frustrated with breastfeeding. °· You have painful breasts or nipples. °· Your nipples are cracked or bleeding. °· Your breasts are red, tender, or warm. °· You have a swollen area on either breast. °· You have a fever or chills. °· You have nausea or vomiting. °· You have drainage other than breast milk from your nipples. °· Your breasts do not become full before feedings by the fifth day after you give birth. °· You feel sad and depressed. °· Your baby is too sleepy to eat well. °· Your baby is having trouble sleeping. °· Your baby is wetting less than 3 diapers in a 24-hour period. °· Your baby has less than 3 stools in a 24-hour period. °· Your baby's skin or the white part of his or her eyes becomes yellow. °· Your baby is not gaining weight by 5 days of age. °Get help right away if: °· Your baby is overly tired (lethargic) and does not want to wake up and feed. °· Your baby develops an unexplained fever. °This information is not intended to replace advice given to you by your health care provider. Make sure you discuss  any questions you have with your health care provider. °Document Released: 09/06/2005 Document Revised: 02/18/2016 Document Reviewed: 02/28/2013 °[employer] Interactive Patient Education © 2017 [employer] °Home Care Instructions for Mom °ACTIVITY °·   Gradually return to your regular activities. °· Let yourself rest. Nap while your baby sleeps. °· Avoid lifting anything that is heavier than 10 lb (4.5 kg) until your health care provider says it is okay. °· Avoid activities that take a lot of effort and energy (are strenuous) until approved by your health care provider. Walking at a slow-to-moderate pace is usually safe. °· If you had a cesarean delivery: °? Do not vacuum, climb stairs, or drive a car for 4-6 weeks. °? Have someone help you at home until you feel like you can do your usual activities yourself. °? Do exercises as told by your health care provider, if this applies. ° °VAGINAL BLEEDING °You may continue to bleed for 4-6 weeks after delivery. Over time, the amount of blood usually decreases and the color of the blood usually gets lighter. However, the flow of bright red blood may increase if you have been too active. If you need to use more than one pad in an hour because your pad gets soaked, or if you pass a large clot: °· Lie down. °· Raise your feet. °· Place a cold compress on your lower abdomen. °· Rest. °· Call your health care provider. ° °If you are breastfeeding, your period should return anytime between 8 weeks after delivery and the time that you stop breastfeeding. If you are not breastfeeding, your period should return 6-8 weeks after delivery. °PERINEAL CARE °The perineal area, or perineum, is the part of your body between your thighs. After delivery, this area needs special care. Follow these instructions as told by your health care provider. °· Take warm tub baths for 15-20 minutes. °· Use medicated pads and pain-relieving sprays and creams as told. °· Do not use tampons or douches until  vaginal bleeding has stopped. °· Each time you go to the bathroom: °? Use a peri bottle. °? Change your pad. °? Use towelettes in place of toilet paper until your stitches have healed. °· Do Kegel exercises every day. Kegel exercises help to maintain the muscles that support the vagina, bladder, and bowels. You can do these exercises while you are standing, sitting, or lying down. To do Kegel exercises: °? Tighten the muscles of your abdomen and the muscles that surround your birth canal. °? Hold for a few seconds. °? Relax. °? Repeat until you have done this 5 times in a row. °· To prevent hemorrhoids from developing or getting worse: °? Drink enough fluid to keep your urine clear or pale yellow. °? Avoid straining when having a bowel movement. °? Take over-the-counter medicines and stool softeners as told by your health care provider. ° °BREAST CARE °· Wear a tight-fitting bra. °· Avoid taking over-the-counter pain medicine for breast discomfort. °· Apply ice to the breasts to help with discomfort as needed: °? Put ice in a plastic bag. °? Place a towel between your skin and the bag. °? Leave the ice on for 20 minutes or as told by your health care provider. ° °NUTRITION °· Eat a well-balanced diet. °· Do not try to lose weight quickly by cutting back on calories. °· Take your prenatal vitamins until your postpartum checkup or until your health care provider tells you to stop. ° °POSTPARTUM DEPRESSION °You may find yourself crying for no apparent reason and unable to cope with all of the changes that come with having a newborn. This mood is called postpartum depression. Postpartum depression happens because your hormone levels change after delivery. If you have   postpartum depression, get support from your partner, friends, and family. If the depression does not go away on its own after several weeks, contact your health care provider. °BREAST SELF-EXAM °Do a breast self-exam each month, at the same time of the  month. If you are breastfeeding, check your breasts just after a feeding, when your breasts are less full. If you are breastfeeding and your period has started, check your breasts on day 5, 6, or 7 of your period. °Report any lumps, bumps, or discharge to your health care provider. Know that breasts are normally lumpy if you are breastfeeding. This is temporary, and it is not a health risk. °INTIMACY AND SEXUALITY °Avoid sexual activity for at least 3-4 weeks after delivery or until the brownish-red vaginal flow is completely gone. If you want to avoid pregnancy, use some form of birth control. You can get pregnant after delivery, even if you have not had your period. °SEEK MEDICAL CARE IF: °· You feel unable to cope with the changes that a child brings to your life, and these feelings do not go away after several weeks. °· You notice a lump, a bump, or discharge on your breast. ° °SEEK IMMEDIATE MEDICAL CARE IF: °· Blood soaks your pad in 1 hour or less. °· You have: °? Severe pain or cramping in your lower abdomen. °? A bad-smelling vaginal discharge. °? A fever that is not controlled by medicine. °? A fever, and an area of your breast is red and sore. °? Pain or redness in your calf. °? Sudden, severe chest pain. °? Shortness of breath. °? Painful or bloody urination. °? Problems with your vision. °· You vomit for 12 hours or longer. °· You develop a severe headache. °· You have serious thoughts about hurting yourself, your child, or anyone else. ° °This information is not intended to replace advice given to you by your health care provider. Make sure you discuss any questions you have with your health care provider. °Document Released: 09/03/2000 Document Revised: 02/12/2016 Document Reviewed: 03/10/2015 °[employer] Interactive Patient Education © 2017 [employer] ° °

## 2017-06-20 NOTE — Discharge Summary (Signed)
OB Discharge Summary  Patient Name: Natalie Cunningham DOB: 1996-02-04 MRN: [id number]  Date of admission: 06/18/2017 Delivering MD: Pincus Large   Date of discharge: 06/20/2017  Admitting diagnosis: 40WKS CTX  Intrauterine pregnancy: [redacted]w[redacted]d     Secondary diagnosis:Active Problems:   Indication for care in labor or delivery   Postpartum care following vaginal delivery  Additional problems:none     Discharge diagnosis: Term Pregnancy Delivered                                                                     Post partum procedures:none  Augmentation: Pitocin  Complications: None  Hospital course:  Onset of Labor With Vaginal Delivery     21 y.o. yo G1P1001 at [redacted]w[redacted]d was admitted in Latent Labor on 06/18/2017. Patient had an uncomplicated labor course as follows:  Membrane Rupture Time/Date: 9:55 AM ,06/18/2017   Intrapartum Procedures: Episiotomy: None [1]                                         Lacerations:  1st degree [2]  Patient had a delivery of a Viable infant. 06/18/2017  Information for the patient's newborn:  ASTHA, PROBASCO [[id number]]  Delivery Method: Vag-Spont    Pateint had an uncomplicated postpartum course.  She is ambulating, tolerating a regular diet, passing flatus, and urinating well. Patient is discharged home in stable condition on 06/20/17.   Physical exam  Vitals:   06/19/17 0509 06/19/17 0830 06/19/17 1740 06/20/17 0612  BP: 114/74 117/73 129/71 101/65  Pulse: 66 70 87 70  Resp: Temp: 98.4 F (36.9 C) 98.1 F (36.7 C) 98.4 F (36.9 C) 98.1 F (36.7 C)  TempSrc:  Oral Oral Oral  SpO2:      Weight:      Height:       General: alert, cooperative and no distress Lochia: appropriate Uterine Fundus: firm DVT Evaluation: No evidence of DVT seen on physical exam. Labs: Lab Results  Component Value Date   WBC 9.3 06/18/2017   HGB 11.9 (L) 06/18/2017   HCT 35.7 (L) 06/18/2017   MCV 80.6 06/18/2017   PLT 321  06/18/2017   CMP Latest Ref Rng & Units 06/18/2017  Glucose 65 - 99 mg/dL 89  BUN 6 - 20 mg/dL 11  Creatinine 7.82 - 9.56 mg/dL 2.13  Sodium 086 - 578 mmol/L 134(L)  Potassium 3.5 - 5.1 mmol/L 4.6  Chloride 101 - 111 mmol/L 101  CO2 22 - 32 mmol/L 23  Calcium 8.9 - 10.3 mg/dL 9.7  Total Protein 6.5 - 8.1 g/dL 7.9  Total Bilirubin 0.3 - 1.2 mg/dL 0.6  Alkaline Phos 38 - 126 U/L 199(H)  AST 15 - 41 U/L 29  ALT 14 - 54 U/L 19    Discharge instruction: per After Visit Summary and "Baby and Me Booklet".  After Visit Meds:  Allergies as of 06/20/2017   No Known Allergies     Medication List    TAKE these medications   ibuprofen 600 MG tablet Commonly known as:  ADVIL,MOTRIN Take 1 tablet (600 mg total)  by mouth every 6 (six) hours.   norethindrone 0.35 MG tablet Commonly known as:  MICRONOR,CAMILA,ERRIN Take 1 tablet (0.35 mg total) by mouth daily.   prenatal multivitamin Tabs tablet Take 1 tablet by mouth daily at 12 noon.            Discharge Care Instructions        Start     Ordered   06/20/17 0000  ibuprofen (ADVIL,MOTRIN) 600 MG tablet  Every 6 hours     06/20/17 0727   06/20/17 0000  Diet - low sodium heart healthy     06/20/17 0727   06/20/17 0000  Call MD for:  temperature >100.4     06/20/17 0727   06/20/17 0000  Call MD for:  severe uncontrolled pain     06/20/17 0727   06/20/17 0000  Call MD for:  difficulty breathing, headache or visual disturbances     06/20/17 0727   06/20/17 0000  Call MD for:  extreme fatigue     06/20/17 0727   06/20/17 0000  Sexual acrtivity    Comments:  None x 6 wks   06/20/17 0727   06/20/17 0000  norethindrone (MICRONOR,CAMILA,ERRIN) 0.35 MG tablet  Daily    Comments:  Begin at 3 wk postpartum-take at same time daily   06/20/17 0727   06/18/17 0000  OB RESULT CONSOLE Group B Strep    Comments:  This external order was created through the Results Console.   06/18/17 0835   06/18/17 0000  OB RESULTS CONSOLE  GC/Chlamydia    Comments:  This external order was created through the Results Console.   06/18/17 0835   06/18/17 0000  OB RESULTS CONSOLE Rubella Antibody    Comments:  This external order was created through the Results Console.    06/18/17 0835   06/18/17 0000  OB RESULTS CONSOLE Varicella zoster antibody, IgG    Comments:  This external order was created through the Results Console.    06/18/17 0835   06/18/17 0000  OB RESULTS CONSOLE HIV antibody    Comments:  This external order was created through the Results Console.    06/18/17 0837   06/18/17 0000  OB RESULTS CONSOLE Hepatitis B surface antigen    Comments:  This external order was created through the Results Console.    06/18/17 0837      Diet: routine diet  Activity: Advance as tolerated. Pelvic rest for 6 weeks.   Outpatient follow up:6 weeks Follow up Appt:No future appointments. Follow up visit: No Follow-up on file.  Postpartum contraception: Progesterone only pills  Newborn Data: Live born female  Birth Weight: 7 lb 5.6 oz (3334 g) APGAR: 8, 9  Baby Feeding: Breast Disposition:home with mother   06/20/2017 Reva Bores, MD

## 2017-08-08 ENCOUNTER — Emergency Department (HOSPITAL_COMMUNITY): Payer: Medicaid Other

## 2017-08-08 ENCOUNTER — Emergency Department (HOSPITAL_COMMUNITY)
Admission: EM | Admit: 2017-08-08 | Discharge: 2017-08-09 | Disposition: A | Discharge status: Home / Self Care | Payer: Medicaid Other | Attending: Emergency Medicine | Admitting: Emergency Medicine

## 2017-08-08 ENCOUNTER — Encounter (HOSPITAL_COMMUNITY): Payer: Self-pay | Admitting: *Deleted

## 2017-08-08 ENCOUNTER — Other Ambulatory Visit: Payer: Self-pay

## 2017-08-08 DIAGNOSIS — R109 Unspecified abdominal pain: Secondary | ICD-10-CM | POA: Diagnosis not present

## 2017-08-08 DIAGNOSIS — Z79899 Other long term (current) drug therapy: Secondary | ICD-10-CM | POA: Insufficient documentation

## 2017-08-08 DIAGNOSIS — K5900 Constipation, unspecified: Secondary | ICD-10-CM | POA: Insufficient documentation

## 2017-08-08 LAB — URINALYSIS, ROUTINE W REFLEX MICROSCOPIC
Bilirubin Urine: NEGATIVE
Glucose, UA: NEGATIVE mg/dL
Ketones, ur: 80 mg/dL — AB
Nitrite: NEGATIVE
Protein, ur: NEGATIVE mg/dL
Specific Gravity, Urine: 1.018 (ref 1.005–1.030)
pH: 5 (ref 5.0–8.0)

## 2017-08-08 LAB — BASIC METABOLIC PANEL
Anion gap: 12 (ref 5–15)
BUN: 9 mg/dL (ref 6–20)
CO2: 20 mmol/L — ABNORMAL LOW (ref 22–32)
Calcium: 9.2 mg/dL (ref 8.9–10.3)
Chloride: 105 mmol/L (ref 101–111)
Creatinine, Ser: 0.86 mg/dL (ref 0.44–1.00)
GFR calc Af Amer: >60 mL/min (ref >60)
GFR calc non Af Amer: >60 mL/min (ref >60)
Glucose, Bld: 79 mg/dL (ref 65–99)
Potassium: 3.9 mmol/L (ref 3.5–5.1)
Sodium: 137 mmol/L (ref 135–145)

## 2017-08-08 LAB — CBC
HCT: 41.7 % (ref 36.0–46.0)
Hemoglobin: 13.9 g/dL (ref 12.0–15.0)
MCH: 27.3 pg (ref 26.0–34.0)
MCHC: 33.3 g/dL (ref 30.0–36.0)
MCV: 81.8 fL (ref 78.0–100.0)
PLATELETS: 303 10*3/uL (ref 150–400)
RBC: 5.1 MIL/uL (ref 3.87–5.11)
RDW: 17.1 % — AB (ref 11.5–15.5)
WBC: 8.6 10*3/uL (ref 4.0–10.5)

## 2017-08-08 LAB — POC URINE PREG, ED: PREG TEST UR: NEGATIVE

## 2017-08-08 MED ORDER — SODIUM CHLORIDE 0.9 % IV BOLUS (SEPSIS)
1000.0000 mL | Freq: Once | INTRAVENOUS | Status: AC
  Administered 2017-08-08: 1000 mL via INTRAVENOUS

## 2017-08-08 MED ORDER — MILK AND MOLASSES ENEMA
1.0000 | Freq: Once | RECTAL | Status: AC
  Administered 2017-08-09: 250 mL via RECTAL
  Filled 2017-08-08: qty 250

## 2017-08-08 NOTE — ED Provider Notes (Signed)
[facility]-EMERGENCY DEPT Provider Note   CSN: [id number] Arrival date & time: 08/08/17  1945     History   Chief Complaint Chief Complaint  Patient presents with  . Constipation    HPI Natalie Cunningham is a 21 y.o. female.  HPI Natalie Cunningham is a 21 y.o. female  Presents to ED with complaint of abdominal pain, constipation, diarrhea, nausea. Pt states she has developed some abdominal discomfort yesterday.  She states today her pain has gotten worse and she reported that she is having some "leaking of green stool."  She reports associated nausea, no vomiting.  She is 5 weeks postpartum, vaginal delivery, no complications.  She states her last normal bowel movement was 4 days ago, which is not normal for her.  She reports a lot of stressors recently, states that she has had a baby, currently separated and getting divorced from her husband, living at a hotel.  She states that she has not been eating or drinking and she states the only thing she had today was a banana.  She denies any fever or chills.  She denies any urinary symptoms.  She knew vaginal symptoms, states she is still having some vaginal discharge she has not been sexually active since her delivery. No hx of the same. She tried eating coconut oil which did not help.   Past Medical History:  Diagnosis Date  . Medical history non-contributory     Patient Active Problem List   Diagnosis Date Noted  . Postpartum care following vaginal delivery 06/19/2017  . Indication for care in labor or delivery 06/18/2017    Past Surgical History:  Procedure Laterality Date  . NO PAST SURGERIES      OB History    Gravida Para Term Preterm AB Living   1 1 1     1    SAB TAB Ectopic Multiple Live Births         0 1       Home Medications    Prior to Admission medications   Medication Sig Start Date End Date Taking? Authorizing Provider  Prenatal Vit-Fe Fumarate-FA (PRENATAL MULTIVITAMIN) TABS tablet  Take 1 tablet by mouth daily at 12 noon.   Yes [provider]  ibuprofen (ADVIL,MOTRIN) 600 MG tablet Take 1 tablet (600 mg total) by mouth every 6 (six) hours. Patient not taking: Reported on 08/08/2017 06/20/17   Reva Bores, MD  norethindrone (MICRONOR,CAMILA,ERRIN) 0.35 MG tablet Take 1 tablet (0.35 mg total) by mouth daily. Patient not taking: Reported on 08/08/2017 06/20/17   Reva Bores, MD    Family History Family History  Problem Relation Age of Onset  . Cancer Father     Social History Social History   Tobacco Use  . Smoking status: Never Smoker  . Smokeless tobacco: Never Used  Substance Use Topics  . Alcohol use: No  . Drug use: No     Allergies   Patient has no known allergies.   Review of Systems Review of Systems  Constitutional: Positive for appetite change. Negative for chills and fever.  Respiratory: Negative for cough, chest tightness and shortness of breath.   Cardiovascular: Negative for chest pain, palpitations and leg swelling.  Gastrointestinal: Positive for abdominal pain, constipation, diarrhea, nausea and rectal pain. Negative for anal bleeding, blood in stool and vomiting.  Genitourinary: Positive for vaginal discharge. Negative for difficulty urinating, dysuria, flank pain, frequency, pelvic pain, urgency, vaginal bleeding and vaginal pain.  Musculoskeletal: Negative for arthralgias,  myalgias, neck pain and neck stiffness.  Skin: Negative for rash.  Neurological: Negative for dizziness, weakness and headaches.  All other systems reviewed and are negative.    Physical Exam Updated Vital Signs BP 116/76 (BP Location: Left Arm)   Pulse (!) 58   Temp 98.1 F (36.7 C)   Resp 19   Ht 5\' 4"  (1.626 m)   Wt 81.6 kg (180 lb)   SpO2 99%   BMI 30.90 kg/m   Physical Exam  Constitutional: She appears well-developed and well-nourished. No distress.  HENT:  Head: Normocephalic.  Eyes: Conjunctivae are normal.  Neck: Neck supple.    Cardiovascular: Normal rate, regular rhythm and normal heart sounds.  Pulmonary/Chest: Effort normal and breath sounds normal. No respiratory distress. She has no wheezes. She has no rales.  Abdominal: Soft. Bowel sounds are normal. She exhibits no distension. There is tenderness. There is no rebound.  LLQ tenderness. No guarding.   Musculoskeletal: She exhibits no edema.  Neurological: She is alert.  Skin: Skin is warm and dry.  Psychiatric: She has a normal mood and affect. Her behavior is normal.  Nursing note and vitals reviewed.    ED Treatments / Results  Labs (all labs ordered are listed, but only abnormal results are displayed) Labs Reviewed  BASIC METABOLIC PANEL - Abnormal; Notable for the following components:      Result Value   CO2 20 (*)    All other components within normal limits  CBC - Abnormal; Notable for the following components:   RDW 17.1 (*)    All other components within normal limits  URINALYSIS, ROUTINE W REFLEX MICROSCOPIC - Abnormal; Notable for the following components:   APPearance CLOUDY (*)    Hgb urine dipstick LARGE (*)    Ketones, ur 80 (*)    Leukocytes, UA LARGE (*)    Bacteria, UA RARE (*)    Squamous Epithelial / LPF 0-5 (*)    All other components within normal limits  POC URINE PREG, ED    EKG  EKG Interpretation None       Radiology Dg Abd 2 Views  Result Date: 08/08/2017 CLINICAL DATA:  Constipation x1 week EXAM: ABDOMEN - 2 VIEW COMPARISON:  None. FINDINGS: Increased fecal retention within the cecum and ascending colon as well as along the proximal descending colon and within the rectosigmoid. No small bowel dilatation. There is no evidence of free air. No radio-opaque calculi or other significant radiographic abnormality is seen. IMPRESSION: Findings in keeping with constipation. Electronically Signed   By: Tollie Ethavid  Kwon M.D.   On: 08/08/2017 23:45    Procedures Procedures (including critical care time)  Medications  Ordered in ED Medications  sodium chloride 0.9 % bolus 1,000 mL (not administered)  sodium chloride 0.9 % bolus 1,000 mL (not administered)     Initial Impression / Assessment and Plan / ED Course  I have reviewed the triage vital signs and the nursing notes.  Pertinent labs & imaging results that were available during my care of the patient were reviewed by me and considered in my medical decision making (see chart for details).     Patient seen and examined.  Patient to the emergency department with lower abdominal pain, no bowel movements for 4 days, now leaking some stool.  Question constipation.  Abdomen is tender in the left lower quadrant, however there is no guarding or rebound tenderness.  Patient noted initially to be tachycardic with heart rate in 130s.  Sinus rhythm.  Orthostatics performed which showed heart rate going up from 105-136 upon standing.  Blood pressure dropped as well.  Will give fluids. Labs with no acute findings. Will get UA   Pt's xray suspicious for constipation.  She received an enema.  She had a very large bowel movement and feels much better.  She no longer has abdominal pain or rectal discomfort.  She states she is ready to be discharged home.  Advised to increase fluids, fiber, start on MiraLAX daily.  Follow-up as needed.  Vitals:   08/08/17 2019 08/08/17 2238 08/09/17 0135 08/09/17 0306  BP: 126/68 116/76 120/79 120/80  Pulse: (!) 126 (!) 58 60 63  Resp: 16 19 19 19   Temp: 98.1 F (36.7 C)  98.4 F (36.9 C) 98.5 F (36.9 C)  TempSrc:   Oral Oral  SpO2: 100% 99% 99% 100%  Weight: 81.6 kg (180 lb)     Height: 5\' 4"  (1.626 m)        Final Clinical Impressions(s) / ED Diagnoses   Final diagnoses:  Constipation  Abdominal pain    ED Discharge Orders    None       Jaynie CrumbleKirichenko, Keyry Iracheta, PA-C 08/09/17 [id number]    Palumbo, April, MD 08/09/17 [phone]

## 2017-08-08 NOTE — ED Triage Notes (Signed)
Pt says when she woke up this morning she was having lower abdominal pain. She had not had a BM in 4 days. Today she has had liquid stool and constipation. Pt has also felt lightheaded that comes and goes through the day, denies CP or SOB. Pt is 5 weeks postpartum

## 2017-08-08 NOTE — ED Notes (Signed)
Pt initially tachy (130's) on arrival to triage room, improved to 110's-120's after rest. Orthostatics competed in triage. [facility]

## 2017-08-09 MED ORDER — POLYETHYLENE GLYCOL 3350 17 G PO PACK: 17.0000 g | PACK | Freq: Every day | ORAL | 0 refills | Status: AC

## 2017-08-09 NOTE — Discharge Instructions (Signed)
Drink plenty of fluids. Increase fiber. Take miralax daily. Follow up as needed.

## 2017-08-09 NOTE — ED Notes (Signed)
Constipation fixed

## 2018-01-06 IMAGING — CR DG ABDOMEN 2V
2 series · 2 of 2 positions shown · non-contrast
Comparison: None.

CLINICAL DATA: Constipation x1 week

EXAM:
ABDOMEN - 2 VIEW

[w abdomen upright]
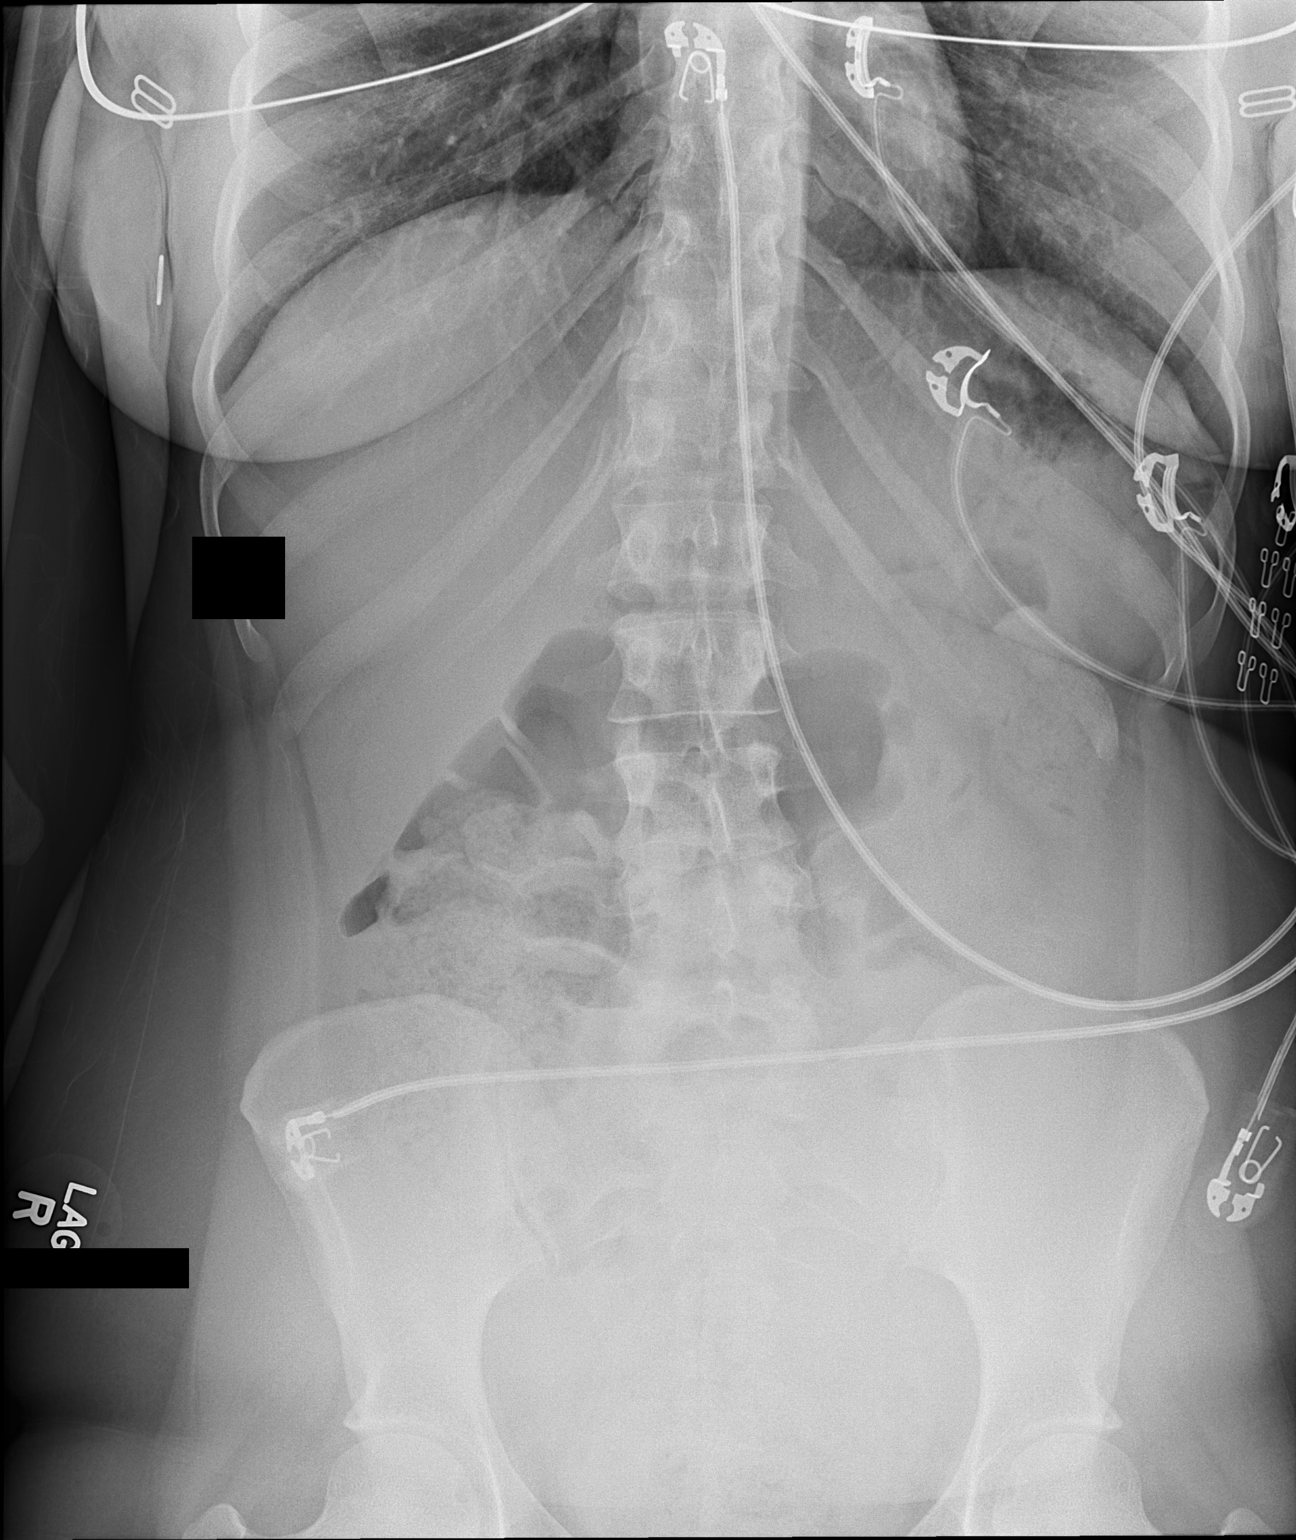

[t abdomen supine]
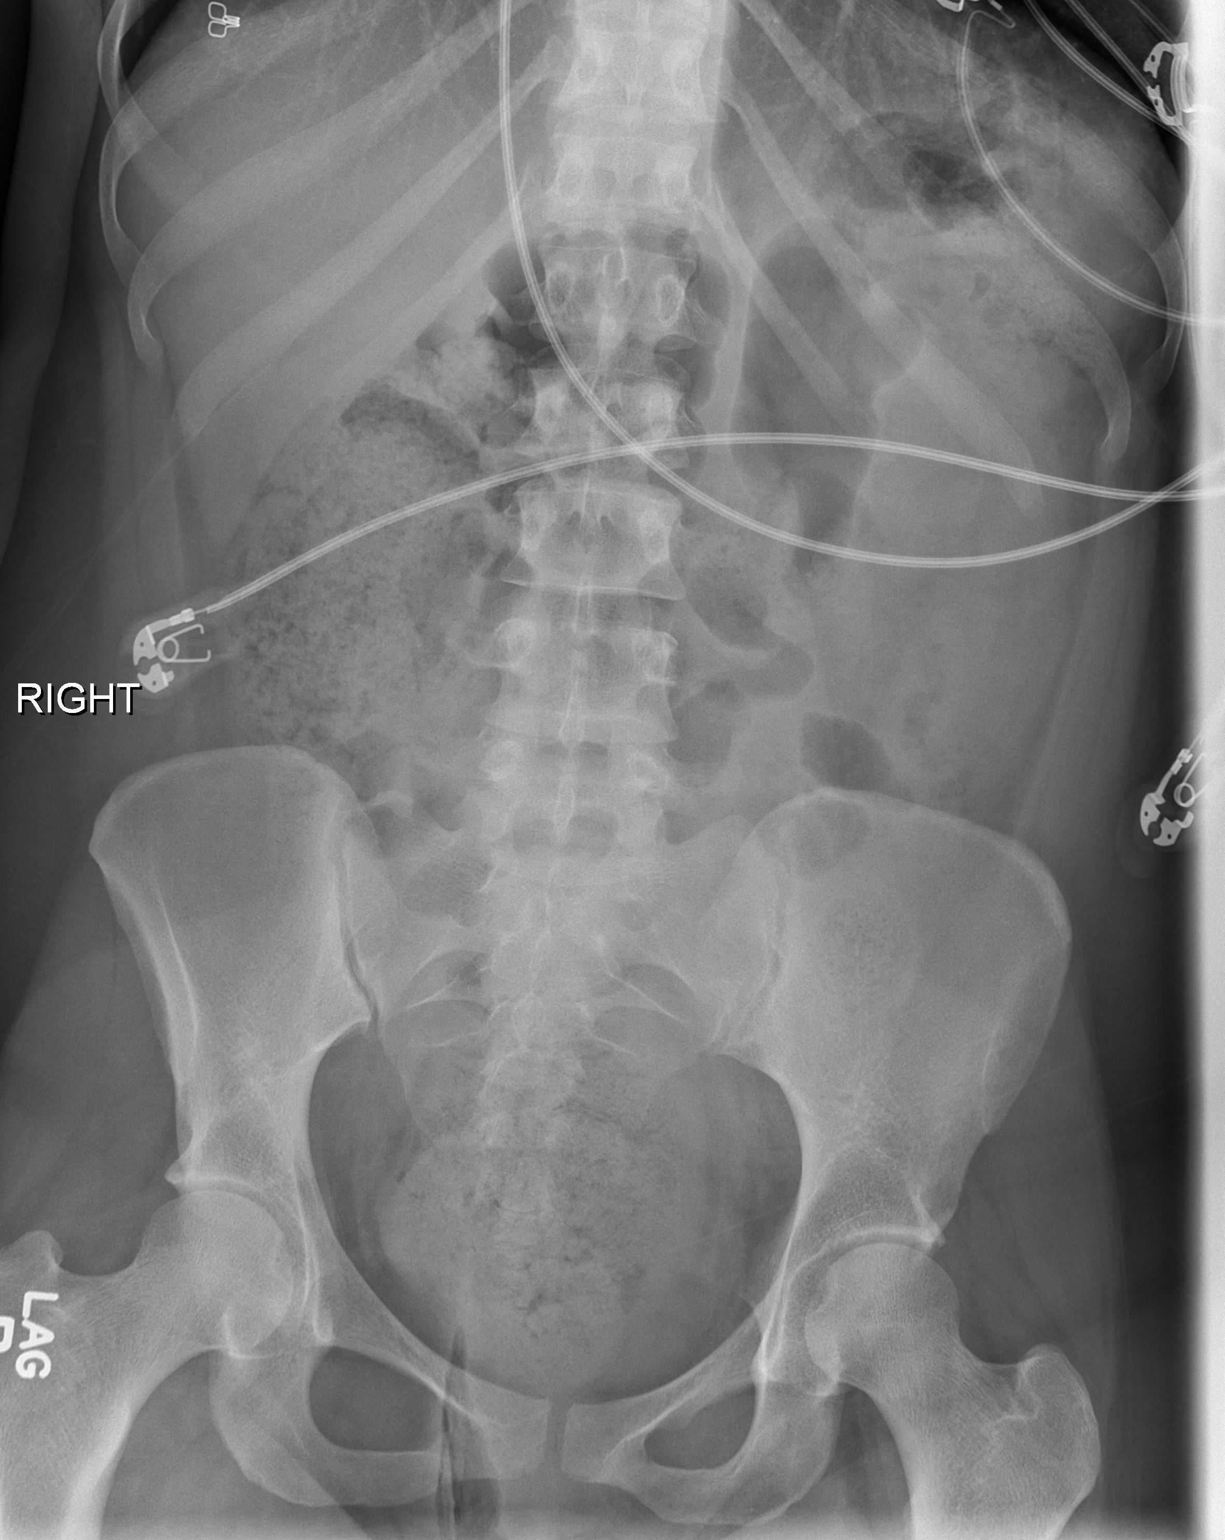

[2 of 2 positions shown; findings below may reference images not displayed]

FINDINGS: Increased fecal retention within the cecum and ascending colon as
well as along the proximal descending colon and within the
rectosigmoid. No small bowel dilatation. There is no evidence of
free air. No radio-opaque calculi or other significant radiographic
abnormality is seen.
IMPRESSION: Findings in keeping with constipation.
